# Patient Record
Sex: Female | Born: 1960 | Race: Black or African American | Hispanic: No | Marital: Married
Health system: Southern US, Community
[De-identification: ages and names within clinical notes are randomized; demographics above are authoritative.]

---

## 1998-06-16 ENCOUNTER — Ambulatory Visit: Admission: RE | Admit: 1998-06-16 | Discharge: 1998-06-16 | Payer: Self-pay | Admitting: Otolaryngology

## 2000-09-29 ENCOUNTER — Encounter: Payer: Self-pay | Admitting: *Deleted

## 2000-09-29 ENCOUNTER — Emergency Department (HOSPITAL_COMMUNITY): Admission: EM | Admit: 2000-09-29 | Discharge: 2000-09-30 | Payer: Self-pay | Admitting: Emergency Medicine

## 2003-08-20 ENCOUNTER — Ambulatory Visit (HOSPITAL_COMMUNITY): Admission: RE | Admit: 2003-08-20 | Discharge: 2003-08-20 | Payer: Self-pay | Admitting: Gastroenterology

## 2005-03-25 ENCOUNTER — Ambulatory Visit: Payer: Self-pay | Admitting: Physical Medicine & Rehabilitation

## 2005-03-25 ENCOUNTER — Inpatient Hospital Stay (HOSPITAL_COMMUNITY): Admission: EM | Admit: 2005-03-25 | Discharge: 2005-03-31 | Payer: Self-pay | Admitting: Emergency Medicine

## 2005-03-31 ENCOUNTER — Inpatient Hospital Stay (HOSPITAL_COMMUNITY)
Admission: RE | Admit: 2005-03-31 | Discharge: 2005-04-02 | Payer: Self-pay | Admitting: Physical Medicine & Rehabilitation

## 2005-08-26 ENCOUNTER — Encounter: Admission: RE | Admit: 2005-08-26 | Discharge: 2005-08-26 | Payer: Self-pay | Admitting: Specialist

## 2005-12-02 ENCOUNTER — Encounter: Admission: RE | Admit: 2005-12-02 | Discharge: 2005-12-02 | Payer: Self-pay | Admitting: Specialist

## 2007-01-04 ENCOUNTER — Inpatient Hospital Stay (HOSPITAL_COMMUNITY): Admission: RE | Admit: 2007-01-04 | Discharge: 2007-01-06 | Payer: Self-pay | Admitting: Specialist

## 2010-06-17 NOTE — Op Note (Signed)
NAMEREVERIE, VAQUERA          ACCOUNT NO.:  [id number]   MEDICAL RECORD NO.:  [id number]          PATIENT TYPE:  INP   LOCATION:  2550                         FACILITY:  [facility]   PHYSICIAN:  Kerrin Champagne, M.D.   DATE OF BIRTH:  1961/01/16   DATE OF PROCEDURE:  DATE OF DISCHARGE:                               OPERATIVE REPORT   PREOPERATIVE DIAGNOSIS:  Retained hardware, right proximal tibia status  post open reduction and internal fixation right bicondylar tibial  plateau fracture, February 2007.   POSTOPERATIVE DIAGNOSIS:  Retained hardware, right proximal tibia status  post open reduction and internal fixation right bicondylar tibial  plateau fracture, February 2007.   PROCEDURE:  Removal of right proximal tibial plates, both medial and  lateral, and screws.  Overdrilling of a single anterior proximal lateral  3.5 locking screw and bone grafting to the proximal tibia where the  screw was removed with allograft cancellous chips.   SURGEON:  Kerrin Champagne, M.D.   ANESTHESIA:  General via orotracheal intubation, Kaylyn Layer. Michelle Piper, M.D.   ESTIMATED BLOOD LOSS:  75 mL.   TOTAL TOURNIQUET TIME:  At 300 mmHg 1 hour 40 minutes.   BRIEF CLINICAL HISTORY:  The patient is a 50 year old female playing  basketball February of 2007.  She was going for a basket, fell.  She  sustained a significant bicondylar tibial plateau fracture of the right  knee and also left bimalleolar ankle fracture.  Underwent ORIF of the  left ankle and the right knee.  Both medial and lateral plates placed,  Ace proximal tibial plate as well as a lateral Ace buttress plate.  Locking screws were used proximally and distal cortical screws to the  medial plate.  The patient has had severe persistent knee pain since the  time of her procedure, underwent arthroscopic evaluation demonstrating  severe degenerative joint disease.  Medial joint line with torn  meniscus.  Torn meniscus was resected.  Pain has  persisted.  She returns  for removal of hardware potentially to eventually undergo total knee  arthroplasty in the future.   DESCRIPTION OF PROCEDURE:  After adequate general anesthesia, the  patient had the right lower extremity prepped with DuraPrep solution and  a tourniquet about the right upper thigh, draped in the usual manner.  Iodine body drape was used.  The right leg was elevated, exsanguinated  with an Esmarch bandage.  Tourniquet inflated to 300 mmHg.   The patient then had incision made through the ulnar incision scar  continuing from about the mid to upper 1/3 of the patella down through  the skin and subcutaneous layers through the peritenon developed  distally over the patella tendon.  A thick soft tissue sleeve was  carried down to the peritenon both proximal-medial and lateral.  Carried  down to the tibia below the anterior tibial tubercle and then dissected  laterally incising the anterior compartment along the lateral aspect of  the crest of the tibia and then continued proximally.  The incision then  was carried along the medial aspect of the patellar tendon and the  proximal medial tibial plateau was also  exposed to the level of the bed  patellar tendon.  Elevation of the large, thick soft tissue sleeve over  the anterior aspect of the tibia and then proximally to the titanium  plate, this was localized.  Small fragment screwdriver was then used to  remove the screws holding the proximal plate in place.  These were three  distal cortical screws  and three proximal cancellous screws.  The plate  was then easily elevated with a Cobb elevator and then removed.  Small  fronds of tissue that had been over the areas of previous holes were  removed using a small Beyer rongeur.  Irrigation was performed over this  medial site.  Fracture appeared to be well healed medially.  Lateral  plate was exposed through the incision and was then performed, elevating  over the plate  using a Cobb elevator exposing the plate from distal to  proximal.  Each of the distal five screws were then removed.  These were  five large fragment cortical screws and they were removed without  difficulty.  Two large proximal locking screws were removed without  difficulty and then the plate mobilized, unfortunately, two small 3.5  locking screws prevented some amount of difficulty, the lower posterior  one was able to be removed with the small fragment screwdriver.  The  anterior one, however, remaining difficult in terms of its ability to be  removed as it was seated within the rim of the plate proximally and  slightly angled so that the entire hex area could not be engaged with a  screwdriver.  Unfortunately, this area stripped and the hex area  stripped with attempts at removing the screw.  Plate was loosened and  brought outside of the soft tissue envelope, however, unable to loosen  the screw physically by removing it so that an attempt at using the easy-  out reverse threaded bit was unsuccessful as well proximally.  Therefore, a TPS with an end cutting rasp-like cutting bur was then used  to remove the screw head from the plate.  First a sponge was placed over  the soft tissue protecting the area.  Two tarsal-type small Homans were  then placed about the plate to retract the skin and soft tissue sleeve  laterally exposing this area and protecting it.  Irrigation was carried  out using copious amounts of irrigant solution and the bur then used to  remove the head of the screw as it engaged the plate proximally.  As  much care was taken as possible in order to keep metallic debris out of  the soft tissues present.  This was done without difficulty.  Once this  was carried out, then the plate was able to be easily removed.  Then  overdrill easy-out was then placed over the remaining screw threaded  portion of the shaft and this was then drilled down. Unfortunately, this  caused  the shaft of the screw to break about 3 to 4 mm deep to the  cortical bone leaving about 1/4 inch size opening present.  The  overreamer was then placed over this remaining area after carefully  curetting the area and exposing the tip of the screw within the bone and  used to further expose the screw.  Once it was further exposed, then the  screw was able to be grasped with a pliers and then the pliers then used  to easily remove the screw, rotating and turning and removing the screw  by twisting.  Once this was completed, soft tissues were debrided of  metallic debris where possible.  Irrigation was carried out.  Arthrotomy  was performed in the right knee through a anterolateral incision, a  vertical incision through the fat pad laterally into the synovium, the  knee joint was exposed, the meniscus preserved.  Evaluation of the  patient's anterior knee joint by placing into varus position  demonstrated the knee to be in good position and the knee to demonstrate  arthrosis changes primarily involving the lateral joint line with a  fissure into the lateral compartment joint irregularity, bone on bone  appearance.  No articular fragments were found within the joint and this  was irrigated with copious amounts of irrigant solution.  The synovium  was then reapproximated with interrupted 0 Vicryl sutures.  Fat pad  approximated with interrupted 0 Vicryl sutures.  The patient underwent  further irrigation in both areas where plates were removed and then the  subcutaneous layers involving the peritenon approximated with  interrupted 0 and #1 Vicryl sutures and the superficial fascial layer of  the anterior compartment of the knee approximated over the anterior  aspect of the knee to the medial subcutaneous layers using interrupted  #1 Vicryl sutures.  The subcutaneous tissue then approximated with  interrupted 2-0 Vicryl sutures and the skin closed with stainless steel  staples over a medium  Hemovac drain placed in the anterior compartment  on the right.   Please note that in the area where the screw had finally been removed  over the lateral aspect of the proximal tibia, bone grafting was  performed to this area of cortical defect using cancellous chips  allograft material and this was done prior to closure of the wound.  Closure of the incision then carried out as noted and then stainless  steel staples used to close the skin.  Adaptic, 4 x 4's, ABD pad fixed  to the skin with Kerlix.  Then a space wrap applied from the  right foot to right upper thigh.  Knee immobilizer applied.  Tourniquet  was released.  Total tourniquet time was 1 hour 40 minutes at 300 mmHg.  The patient was then reactivated, extubated, returned to the recovery  room in satisfactory condition.  All instrument and sponge counts were  correct.      Kerrin Champagne, M.D.  Electronically Signed     JEN/MEDQ  D:  01/04/2007  T:  01/04/2007  Job:  [id number]

## 2010-06-20 NOTE — Op Note (Signed)
[name], [name]                      ACCOUNT NO.:  [id number]   MEDICAL RECORD NO.:  [id number]                   PATIENT TYPE:  AMB   LOCATION:  DFTL                                 FACILITY:  [facility]   PHYSICIAN:  Anselmo Rod, M.D.               DATE OF BIRTH:  February 19, 1960   DATE OF PROCEDURE:  08/20/2003  DATE OF DISCHARGE:                                 OPERATIVE REPORT   PROCEDURE:  Colonoscopy.   ENDOSCOPIST:  Charna Elizabeth, M.D.   INSTRUMENT USED:  Olympus video colonoscope.   INDICATIONS FOR PROCEDURE:  Change in bowel habits with abdominal pain in a  50 year old female.  Rule out colonic polyps, masses, etc.   PROCEDURE PERFORMED:  Informed consent was procured from the patient.  The  patient fasted for eight hours prior to the procedure and prepped with a  bottle of magnesium citrate and a gallon of GOLYTELY the night prior to the  procedure.   PREPROCEDURE PHYSICAL EXAMINATION:  VITAL SIGNS:  The patient had stable  vital signs.  NECK:  Supple.  CHEST:  Clear to auscultation.  HEART:  S1 and S2 regular.  ABDOMEN:  Soft with normal bowel sounds.   DESCRIPTION OF PROCEDURE:  The patient was placed in the left lateral  decubitus position and sedated with 70 mg of Demerol and 7 mg of Versed  intravenously in incremental doses.  Once the patient was adequately sedated  and maintained on low flow oxygen and continuous cardiac monitoring, the  Olympus video colonoscope was advanced from the rectum to the cecum.  The  appendiceal orifice and ileocecal valve were clearly visualized and  photographed.  No masses, polyps, erosions, ulcerations, or diverticula were  seen.  Retroflexion in the rectum revealed normal anatomy.   IMPRESSION:  Normal colonoscopy up to the cecum.   RECOMMENDATIONS:  1. Repeat colonoscopy in the next 10 years unless the patient develops any     abnormal symptoms in the interim.  2. Outpatient followup in the next two weeks for further  recommendations.  3. Continue a high fiber diet for now with liberal fluid intake.                                               Anselmo Rod, M.D.    JNM/MEDQ  D:  08/20/2003  T:  08/20/2003  Job:  [id number]   cc:   Gabriel Earing, M.D.  [address]  [place]  [place] [address]  Fax: [phone]

## 2010-06-20 NOTE — Op Note (Signed)
Ashley [name], Ashley [name]          ACCOUNT NO.:  [id number]   MEDICAL RECORD NO.:  [id number]          PATIENT TYPE:  INP   LOCATION:  5040                         FACILITY:  [facility]   PHYSICIAN:  Kerrin Champagne, M.D.   DATE OF BIRTH:  06-03-1960   DATE OF PROCEDURE:  03/25/2005  DATE OF DISCHARGE:                                 OPERATIVE REPORT   PREOPERATIVE DIAGNOSIS:  Right knee bicondylar tibial plateau fracture with  minimally displaced medial tibial plateau and a split depressed lateral  plateau fracture. Significant displacement of the lateral plateau component.  Left ankle supination external rotation injury with a long oblique spiral  fracture of the fibula with comminution.  A fleck of bone off the medial  malleolus distally with a torn deltoid ligament.   POSTOPERATIVE DIAGNOSIS:  Right knee bicondylar tibial plateau fracture with  minimally displaced medial tibial plateau and a split depressed lateral  plateau fracture. Significant displacement of the lateral plateau component.  Left ankle supination external rotation injury with a long oblique spiral  fracture of the fibula with comminution.  A fleck of bone off the medial  malleolus distally with a torn deltoid ligament.   PROCEDURE:  1.  Open reduction and internal fixation of a right bicondylar tibial      plateau fracture via median approach with application of a six-hole      buttress plate to the anterior medial tibial plateau with fixation      screws anterior to posterior and three distal screws.  Open reduction      and internal fixation of the lateral plateau fracture by elevation of      the depressed fracture fragment bone graft to the proximal lateral      tibial plateau metaphysis using tricortical iliac crest bone graft and      cancellus chips.  Internal fixation using a DePuy trauma buttress plate      with five distal screws and six proximal locking it, only five of the      proximal locking screws  were used for fixation purposes.  2.  Open reduction internal fixation of left a bimalleolar ankle fracture      including the lateral malleolus and posterior malleolus with application      of an eight hole Ace 3.5 DCP and a 3.5 lag screw anterior to posterior.   SURGEON:  Kerrin Champagne, M.D.   ASSISTANT:  None.   ANESTHESIOLOGIST:  Kaylyn Layer. Michelle Piper, M.D.   TOTAL TOURNIQUET TIME:  Right leg 1 hour and 52 minutes, left leg 42  minutes.   ESTIMATED BLOOD LOSS:  Right knee 200 mL, left ankle 50 mL.   COMPLICATIONS:  None.   CLINICAL HISTORY:  This patient is a 50 year old female who reportedly had  been drinking some alcohol and playing basketball when she jumped for a  basket she returned down and tried to avoid landing on a 60-year-old.   INTRAOPERATIVE FINDINGS:  The patient had extremely comminuted to the  depressed lateral tibial plateau fracture.  The central portions of the  fracture surface of the lateral joint line were carefully  elevated and  placed into position alignment, bone grafting then underneath using  cancellus chips as well as a tricortical iliac crest bone graft and  allograft piece.  This tended to support the surface quite nicely, returning  the leg to neutral.  The left ankle reduced quite nicely with a single lag  screw approximating the major portions of the fracture fragment and then  using an eight hole DCP to approximate the fracture site.   DESCRIPTION OF PROCEDURE:  After adequate general anesthesia, the patient  transferred to the fracture table.  C-arm fluoro was draped sterilely  brought into the field.  After standard prep with DuraPrep solution, both  lower extremities dual the lower extremity drape, tourniquets about the  upper thigh at both levels with separate tourniquet machines.  Given  preoperative antibiotics of Ancef.  Dissection was carried deeply down to  the deep fascia which was the superficial retinaculum of the knee and the   external quad mechanism.  Both medial and lateral flaps were developed deep  to the superficial fascial layer exposing some areas of the lateral aspect  of the deep retinaculum in the lateral view. Incision then carried down to  the patella tendon, also elevating the superficial fascia off the patella  tendon bilaterally down to the anterior.  Dissection then carried laterally  elevating the anterior compartment off of the right proximal tibia and then  laterally.  Also elevating the soft tissues medially along the medial tibial  metaphysis.  And elevating this quite nicely exposing the anterior aspect of  the proximal tibia medially and laterally.  The retinaculum knee and  synovial layer was maintained in its attachments to the proximal tibia  laterally.  An incision was made into the lateral third of the patellar  tendon then carried up to the patella itself and then this was used to  rotate the lateral fracture in anatomic position alignment.  An Ace buttress  plate was then carefully approximated to the anterior aspect of the medial  tibial plateau, it provided excellent fixation against the surface.  The  screws were then placed from anterior to posterior using 3 cortical screws  proximally and then distally 3.5 screws as well, fixing the medial tibial  plateau in anatomic position and alignment.  Attention was then turned to  the lateral tibial plateau surface which was approached posteriorly to open  up the metaphysis internally.  Bone grafting was then performed using  cancellus chips impacting them with a bone impactor and then tricortical  iliac crest to elevate the joint surface to hold it steadfast.  The incision  into the patella tendon was continued through the anterior fat pad of the  knee into the knee joint, the knee joint could be palpated.  There were no  loose fragments and the joint surface appeared to reduce with the elevation. A lateral split was then reduced  carefully and tongs placed across the  bicondylar area displaying the leg in varus until the fragment reduced to  near anatomic position alignment.  With this in place, then the tibial  buttress plate for the right-sided leg was then placed on the lateral  plateau surface carefully approximated against the lateral plateau surface.  Distal five screw holes were then each individually drilled and approximated  using cortical screws, fixing the plate and buttressing the lateral tibial  plateau quite nicely into position alignment.  The three screw holes  available for locking screws were then filled to the whole  level just below  the more proximal three hole levels.  These were filled using 4.5 locking  screws.  More proximal screw holes were then filled also using locking  screws.  Again, holding up the lateral joint surface and buttressing it  quite nicely.  This completed fixation of the proximal bicondylar tibial  plateau fracture.  Irrigation was performed.  Careful inspection  demonstrated the muscles to be viable over the anterior lateral aspect of  the leg.  A medium Hemovac drain was placed both over the medial side of the  proximal tibia and lateral proximal tibia.  The anterior compartment  carefully sutured its superficial fascial layers down to the anterior crest  of the knee.  Subcu layers were then approximated with interrupted 0-0 and 2-  0 Vicryl sutures and skin approximated with a running subcu stitch of 3-0  Vicryl suture.  Tincture of Benzoin and Steri-Strips applied, 4x4s, ABD pad  affixed to the skin with sterile Webril and then a well-padded long-leg the  dressing of Ace wraps applied and knee immobilizer placed.  A pad placed  under the right ankle and heel to keep it off of the operating table.   Attention then turned to the left ankle.  The left leg was elevated,  exsanguinated with Esmarch bandage, tourniquet inflated to 250 mmHg.  An  incision was made along the  lateral aspect of the fibula superficially  extending from the tip of the fibula proximally over an area of nearly 6  inches through the skin and subcu layers directly down to the superficial  fibula.  The incision was carried down to the periosteum.  The periosteum  was then elevated anterior and proximal.  Fracture site identified.  The  fracture over the anterior aspect of the distal fibula was identified, I  externally rotated the leg irrigating the joint in order to remove any bony  debris through this lateral approach.  The ankle was then reduced by  internal rotation and longitudinal traction and then a clamp placed over the  major portions of the fracture site distally reapproximating and  interdigitating the fracture margins to anatomic position alignment.  This  was held in place with a reducing tenaculum using a proximal lobster claw.  With this held in place, a 3.5 drill bit was used to make an oblique hole from anterior to posterior to lie across the fracture site.  A sleeve was  then, measured for depth and the appropriate size screw place.  A 3.5 DCP  plate of the Ace type was then carefully contoured to the lateral aspect of  the fracture site.  This was placed over the posterior lateral aspect of the  fibula and contoured to the distal crest of the fibula as it flared.  This  plate was placed over the posterior long oblique fracture proximally in  order to hold it reduced when then compressed down.  Then, the plate was  then carefully placed and screwed into place leaving a single open over the  oblique fracture site.  Distally three fully threaded cancellus screws were  used, proximally four cortical screws were used to fix the fibula in good  position alignment.  Irrigation was then performed.  There was no active  bleeding present.  The wound was then closed after C-arm evaluation of joint  space returned to anatomic position alignment.  There was no evidence of   displacement of either medial or posterior malleolar fragments.  Irrigation  was further  performed and then this deep subcu layers approximated with  interrupted 0 Vicryl sutures, more superficial layers with interrupted 2-0  Vicryl sutures, skin closed with stainless steel staples.  Dressing of  Adaptic 4x4s, ABD pad affixed to the skin with sterile Webril.  Then a well-  padded posterior splint applied to the left leg extending from the  metatarsal phalangeal joints to just below the knee.  The ankle held in 90  degrees.  The patient's tourniquet was released and the total tourniquet  time was 42 minutes for the left ankle.  The patient was then reactivated,  returned to her bed, extubated, returned to the recovery room in  satisfactory condition.  All instrument and sponge counts were correct  regarding this patient.  She was discharged to the postop anesthesia care  unit in satisfactory condition.      Kerrin Champagne, M.D.  Electronically Signed     JEN/MEDQ  D:  03/27/2005  T:  03/28/2005  Job:  [id number]

## 2010-06-20 NOTE — Discharge Summary (Signed)
NAMESHARMAIN, [name]          ACCOUNT NO.:  [id number]   MEDICAL RECORD NO.:  [id number]          PATIENT TYPE:  IPS   LOCATION:  4001                         FACILITY:  [facility]   PHYSICIAN:  Ranelle Oyster, M.D.DATE OF BIRTH:  1960/12/15   DATE OF ADMISSION:  03/31/2005  DATE OF DISCHARGE:  04/02/2005                                 DISCHARGE SUMMARY   DISCHARGE DIAGNOSES:  1.  Right bicondylar tibial plateau fracture with displacement and left      trimalleolar ankle fracture.  2.  Acute blood loss anemia, stable.  3.  Urinary tract infection.  4.  Acute hypokalemia, resolved.   HISTORY OF PRESENT ILLNESS:  Ashley [name] is a 50 year old female with a  history of osteoporosis, who fell March 24, 2005 while playing basketball  and landed on bilateral lower extremities, sustaining closed left  trimalleolar ankle fracture and right knee bicondylar tibial plateau  fracture with displaced fragments.  She underwent ORIF of right knee  bicondylar fracture, closed reduction, left ankle, with splint placement.  She is currently non-weightbearing, bilateral lower extremities, plus a  Bledsoe brace is used on right knee with range of motion at 0-90 degrees in  brace.  She was also noted to have a fracture blister on medial aspect of  right knee incision.  She was noted to have postop fevers that are  resolving.  Acute blood loss anemia was noted with most recent H&H at 3.4  and 24.9.  Therapies were initiated and the patient was noted to require  assistance with ADLs and mobility.  Rehab consulted for further therapy.   PAST MEDICAL HISTORY:  Past medical history significant for:  1.  TAH.  2.  Osteoporosis.   ALLERGIES:  No known drug allergies.   FAMILY HISTORY:  Positive for coronary artery disease.   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  She and her  husband take care of 2 grandchildren at home.  She  currently has a daughter from out of town who is assisting them.  The  patient does not use any tobacco, ? alcohol use.   HOSPITAL COURSE:  Ashley [name] was admitted to Rehab on March 31, 2005 for inpatient therapies to consist of PT and OT daily.  Past  admission, she was maintained at non-weightbearing status in bilateral lower  extremities.  Coumadin was continued for DVT prophylaxis.  Secondary to  fever, UA/UCS were sent off.  Labs were done past admission showing H&H at  8.6 and 28.6.  The patient was maintained on iron supplements.  Check of  electrolytes showed hypokalemia with potassium at 3.3 and she was started on  potassium supplement.  The patient's right knee incision was noted to be  healing well without any signs or symptoms of infection.  Staples were noted  to be in place.  Left lower extremity was in splint and noted to be  neurovascularly intact.  Evaluation of patient past admission showed the  patient  to be at relatively high level.  The patient did progress to  modified-independent level for bed mobility, modified-independent for  sliding board transfers by April 02, 2005.  She was modified-independent for  navigating wheelchair 150 feet.  She requires setup-assist for ADLs and was  able to bathe and dress herself without difficulty, was able to toilet at  supervision level.  Followup labs of April 02, 2005 showed H&H stable at 8.9  and 26.5.  Check of electrolytes revealed hypokalemia to have resolved with  sodium 136, potassium 3.7, chloride 100, CO2 27, BUN 10, creatinine 0.4,  glucose 98.  The patient's UA was noted to be positive.  The patient was  started on amoxicillin x5 days.  Patient to continue on iron supplement for  now.  She is to follow up with LMD for recheck CBC as well as followup UA/UC  in approximately a month.  She is to continue on potassium supplements for  the next 48 hours.   Patient to continue on Coumadin for now with next pro  time to be checked on April 03, 2005, Dr. Otelia Sergeant to decide on complete  duration of Coumadin, depending on the patient's mobility.  On April 02, 2005, the patient is discharged to home.  He is to have continued followup  home health PT by [facility].   DISCHARGE MEDICATIONS:  1.  Coumadin 2 mg two p.o. q.p.m.  2.  OxyContin CR 20 mg p.o. q.12 h. x2 weeks and 20 mg one per day x10 days,      then discontinue.  3.  Trinsicon one p.o. twice daily.  4.  Claritin-D one p.o. per day.  5.  Senokot-S two p.o. nightly.  6.  Robaxin 500 mg p.o. 4 times daily.  7.  K-Dur 20 mEq twice daily x2 days.  8.  OxyIR 5 mg to 10 mg q.4-6 h. p.r.n. pain.  9.  Amoxicillin 250 mg 3 times daily x5 days.   ACTIVITY:  Activity level is as tolerated with wheelchair, no weight on  bilateral lower extremities, no walking, Bledsoe brace to right knee at all  times, keep splint on left ankle clean and dry.   DIET:  Regular.   FOLLOWUP:  Patient to follow up with Dr. Otelia Sergeant next Tuesday for recheck and  to decide on duration of Coumadin, follow up with Dr. Leonette Most in 1 month,  follow up with Dr. Faith Rogue as needed.      Greg Cutter, P.A.      Ranelle Oyster, M.D.  Electronically Signed    PP/MEDQ  D:  04/02/2005  T:  04/03/2005  Job:  [id number]   cc:   Loyal Jacobson MD   Kerrin Champagne, M.D.  Fax: [phone]

## 2010-06-20 NOTE — Discharge Summary (Signed)
Ashley [name], Ashley [name]          ACCOUNT NO.:  [id number]   MEDICAL RECORD NO.:  [id number]          PATIENT TYPE:  INP   LOCATION:  5040                         FACILITY:  [facility]   PHYSICIAN:  Kerrin Champagne, M.D.   DATE OF BIRTH:  [birth year]   DATE OF ADMISSION:  03/24/2005  DATE OF DISCHARGE:  03/31/2005                                 DISCHARGE SUMMARY   ADMISSION DIAGNOSES:  1.  Bicondylar tibial plateau fracture, closed on the right.  2.  Trimalleolar ankle fracture dislocation on the left.  3.  Osteoporosis.   DISCHARGE DIAGNOSES:  1.  Bicondylar tibial plateau fracture, closed on the right.  2.  Trimalleolar ankle fracture dislocation on the left.  3.  Osteoporosis.  4.  Posthemorrhagic anemia.   PROCEDURES:  1.  On March 25, 2005, the patient underwent open reduction internal      fixation right bicondylar tibial plateau fracture and open reduction      internal fixation of lateral plateau fracture utilizing tricortical      iliac crest bone graft and cancellus chips on the right.  2.  Reaction internal fixation of left bimalleolar ankle fracture including      a lateral malleolus and posterior malleolus with application of plates      and screws.  This was performed by Dr. Otelia Sergeant under general anesthesia.   CONSULTATIONS:  Physical medicine and rehabilitation of [facility].   BRIEF HISTORY:  The patient's 50 year old black female who was playing  basketball and when she jumped from a rebound, tried to avoid landing on a 50-  year-old child and turned twisting her right knee and left ankle.  She was  brought to [facility] Emergency Room.  There, she  underwent close reduction of the left ankle fracture by Dr. Read Drivers.  She was  seen by Dr. Otelia Sergeant and admitted to the hospital for surgical intervention.   BRIEF HOSPITAL COURSE:  The patient tolerated the procedure under general  anesthesia without complications.   Postoperatively, she was started on  physical therapy but was not allowed weightbearing on either lower  extremity.  Physical therapy assisted with the bed to chair transfers  utilizing a sliding board.  She was eventually able to do bed to wheelchair  transfers.  She had considerable difficulty in her activity level and rehab  consult was obtained.  During the hospital stay, Hemovac drains were  discontinued on the second postoperative day.  Dressing changes were done  daily thereafter.  She was noted to have fracture blisters at the right knee  which were treated with daily dressing changes.  Bilateral Doppler studies  to rule out DVT on postop day #2 was negative.  The patient underwent active  and passive range of motion of the right ankle and left knee.  Initially,  she utilized PCA analgesics and was weaned to p.o. analgesics without  difficulty.  She did complain of sinus drainage which was treated with over-  the-counter medications.  The patient was placed on Coumadin for DVT  prophylaxis.  On March 30, 2005, the patient  was fitted with a Bledsoe  brace to the right knee to begin active range of motion.  A rehab bed did  become available and patient was transferred for continuation of her  orthopedic care.   PERTINENT LABORATORY VALUES:  CBC on admission with a WBC 10.8, hemoglobin  12, hematocrit 35.8.  Hemoglobin and hematocrit dropped to the lowest value  of 8.4 and 24.9, respectively.  Chemistry studies on admission were within  normal limits with exception of mildly elevated glucose of 136.  The patient  had one episode of hyponatremia which resolved.  Coagulation studies on  admission were normal.  Prior to transfer to rehab INR noted to be 2.5.  Urinalysis preoperatively was negative.  On March 31, 2005, after Foley  catheter discontinued, urinalysis showed moderate hemoglobin with 30  protein, moderate leukocyte esterase, many epithelial cells, 3 to 6 WBCs, 3  to 6  RBCs and many bacteria.   PLAN:  The patient was transferred to [facility].  There she will continue to do bed to wheelchair transfers, nonweightbearing  bilateral lower extremities.  Range of motion to the right knee with Bledsoe  brace open at 0 to 90 degrees.  She will continue with range of motion of  the left knee actively.  Right ankle range of motion as well.  Occupational  therapy will assist with ADLs.  A list of medications were sent with the  patient and will continue to be utilized there.  She will remain on DVT  prophylaxis.  The patient is advised to follow up with Dr. Otelia Sergeant following  her stay rehab.      Wende Neighbors, P.A.      Kerrin Champagne, M.D.  Electronically Signed    SMV/MEDQ  D:  07/20/2005  T:  07/21/2005  Job:  [id number]

## 2010-06-20 NOTE — H&P (Signed)
[name], [name]          ACCOUNT NO.:  [id number]   MEDICAL RECORD NO.:  [id number]          PATIENT TYPE:  IPS   LOCATION:  4001                         FACILITY:  [facility]   PHYSICIAN:  Erick Colace, M.D.DATE OF BIRTH:  16-Nov-1960   DATE OF ADMISSION:  03/31/2005  DATE OF DISCHARGE:                                HISTORY & PHYSICAL   A 50 year old female with history of osteoporosis who fell on March 24, 2005, while playing basketball and landed on bilateral lower extremities,  sustained closed left trimalleolar ankle fracture and right knee bicondylar  tibial plateau fracture with displaced fragments.  She underwent ORIF right  knee bicondylar fracture as well as left ankle.  She is postoperatively  nonweightbearing, placed in a Bledsoe brace on the right with range of  motion 0 to 90 degrees within the brace. She has a splint on the left ankle.  Postoperative fever 101.  Pain control has been poor with movement.  Acute  blood loss anemia with hemoglobin 8.4 on February 27.  INR was 2.5.   REVIEW OF SYSTEMS:  Positive for pain in the legs, no numbness.  No bowel or  bladder difficulties other than some constipation resolved by laxatives.   PAST MEDICAL HISTORY:  1.  Total abdominal hysterectomy.  2.  Osteoporosis.   FAMILY HISTORY:  Coronary artery disease.   SOCIAL HISTORY:  Married.  Lives in one-level home with two steps to enter.  Working as Tax adviser for [employer].  Two grandchildren at home.  No  alcohol, no tobacco.   FUNCTIONAL HISTORY:  Independent prior to admission.   FUNCTIONAL STATUS:  Impaired mobility and ADLs.   LABORATORY DATA:  Chest x-ray February 21: No acute disease.   ALLERGIES:  None known.   HOME MEDICATIONS:  None.   PHYSICAL EXAMINATION:  VITAL SIGNS: Blood pressure 106/66, pulse 102,  respirations 20, temperature 98.2.  GENERAL:  No acute distress.  Mood and affect appropriate.  HEENT:  Eyes anicteric, not injected.  NECK: Supple without adenopathy.  LUNGS: Respiratory effort is good, lungs clear.  HEART: Regular rate and rhythm.  Pedal pulses are unable to assess secondary  to splints.  ABDOMEN:  Positive bowel sounds. Soft, nontender to palpation.  EXTREMITIES: Normal bilateral upper extremities. Right lower extremity in  Bledsoe brace, has ABD over incision.  Left ankle in a splint with ACE wrap.  She has good sensation in her toes bilaterally.  No toe swelling.  She has  5/5 strength bilateral upper extremities and 4/5 left hip flexor. Knee  extensor, ankle dorsiflexor, and plantar flexor cannot be assessed.  On the  right side, she has 1 in the hip flexor, and quad cannot be assessed, TA or  gastroc.   IMPRESSION:  1.  Right bicondylar tibial plateau fracture, left trimalleolar fracture      status post open reduction and internal fixation.  Nonweightbearing      bilateral lower extremities due to a fall sustained while playing      basketball March 24, 2005.  She is unable to ambulate and still is      requiring  assistance even to get in and out of wheelchair and in and out      of bed, toilet, as well as dressing and bathing.  2.  Bilateral lower extremity pain due to fractures. Will increase OxyContin      CR to 20 mg p.o. q.12h.  3.  Fever of unknown etiology, may be a urinary tract infection.  Will check      UA C&S.  She has no symptoms in this regard.  4.  Acute blood loss anemia.  Add iron supplement and monitor CBC.  Will      check one in the morning.  5.  Constipation.  Add Senna.   ESTIMATED LENGTH OF STAY:  5 to 7 days.  The patient's own goal is 3 days.      Erick Colace, M.D.  Electronically Signed     AEK/MEDQ  D:  03/31/2005  T:  03/31/2005  Job:  [id number]   cc:   Anselmo Rod, M.D.  Fax: [phone]   Kerrin Champagne, M.D.  Fax: [phone]   Ranelle Oyster, M.D.  Fax: [phone]

## 2010-11-10 LAB — TYPE AND SCREEN
ABO/RH(D): O POS
Antibody Screen: NEGATIVE

## 2010-11-11 LAB — CBC: MCV: 76.7 — ABNORMAL LOW

## 2012-05-13 ENCOUNTER — Other Ambulatory Visit (HOSPITAL_BASED_OUTPATIENT_CLINIC_OR_DEPARTMENT_OTHER): Payer: Self-pay | Admitting: Family Medicine

## 2012-05-13 ENCOUNTER — Ambulatory Visit (HOSPITAL_BASED_OUTPATIENT_CLINIC_OR_DEPARTMENT_OTHER)
Admission: RE | Admit: 2012-05-13 | Discharge: 2012-05-13 | Disposition: A | Discharge status: Home / Self Care | Payer: Managed Care, Other (non HMO) | Source: Ambulatory Visit | Attending: Family Medicine | Admitting: Family Medicine

## 2012-05-13 DIAGNOSIS — R079 Chest pain, unspecified: Secondary | ICD-10-CM

## 2013-02-10 ENCOUNTER — Other Ambulatory Visit (HOSPITAL_COMMUNITY): Payer: Self-pay | Admitting: Obstetrics and Gynecology

## 2013-02-10 DIAGNOSIS — E041 Nontoxic single thyroid nodule: Secondary | ICD-10-CM

## 2013-02-14 ENCOUNTER — Ambulatory Visit (HOSPITAL_COMMUNITY)
Admission: RE | Admit: 2013-02-14 | Discharge: 2013-02-14 | Disposition: A | Discharge status: Home / Self Care | Payer: Managed Care, Other (non HMO) | Source: Ambulatory Visit | Attending: Obstetrics and Gynecology | Admitting: Obstetrics and Gynecology

## 2013-02-14 DIAGNOSIS — E042 Nontoxic multinodular goiter: Secondary | ICD-10-CM | POA: Insufficient documentation

## 2013-02-14 DIAGNOSIS — E041 Nontoxic single thyroid nodule: Secondary | ICD-10-CM

## 2015-07-20 IMAGING — US US SOFT TISSUE HEAD/NECK
1 series · 14 of 25 positions shown · non-contrast
Comparison: None.

CLINICAL DATA: Right-sided thyroid nodule

EXAM:
THYROID ULTRASOUND
TECHNIQUE: Ultrasound examination of the thyroid gland and adjacent soft
tissues was performed.

[Series 1: us soft tissue head/neck · 0.07mm/px · 14 of 51 slices shown]
[im 1/51]
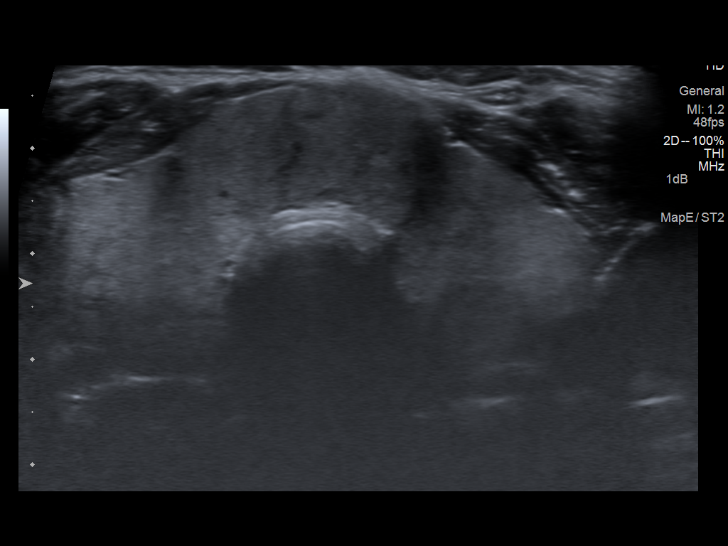
[im 5/51]
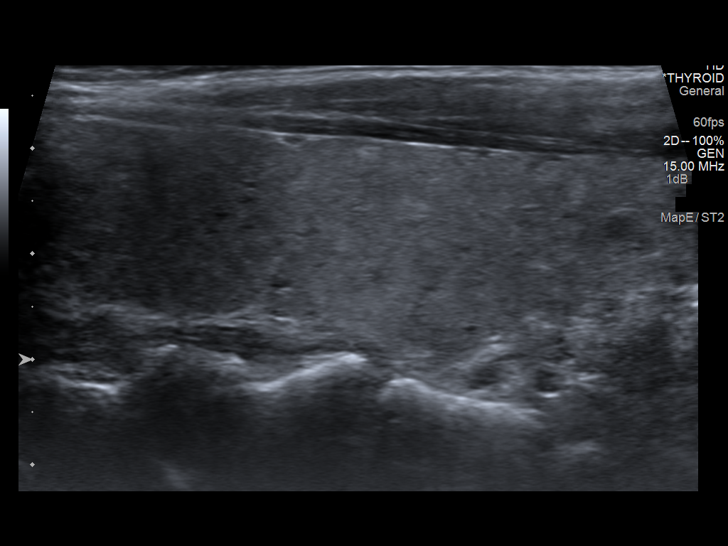
[im 9/51]
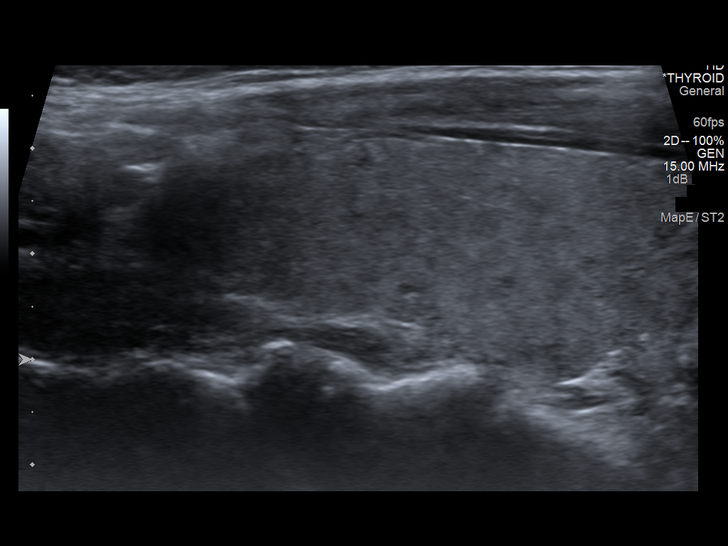
[im 13/51]
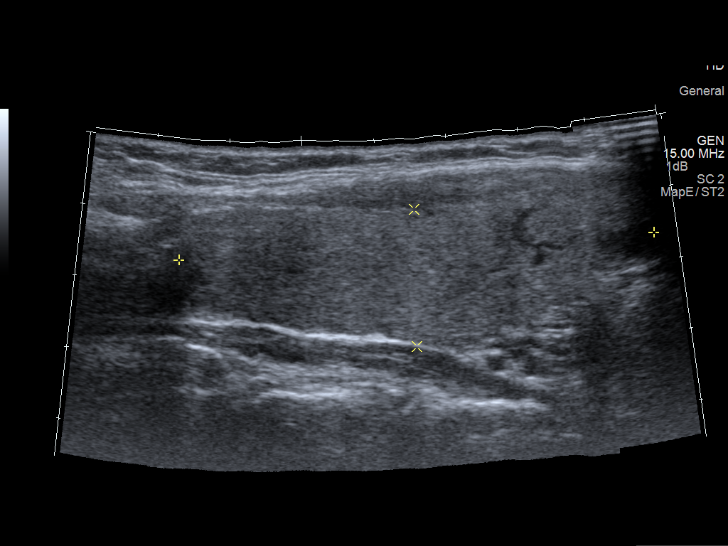
[im 17/51]
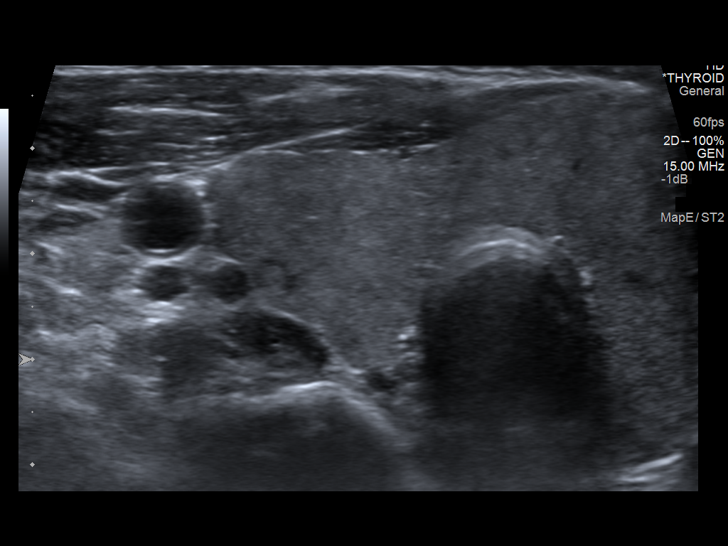
[im 19/51]
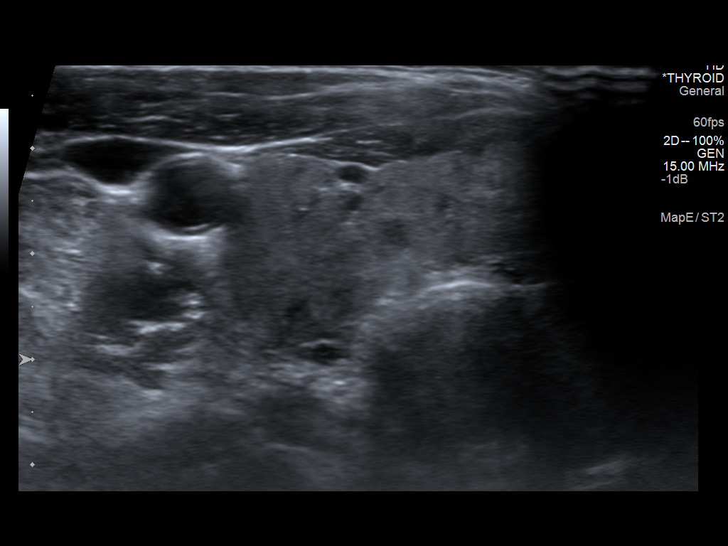
[im 23/51]
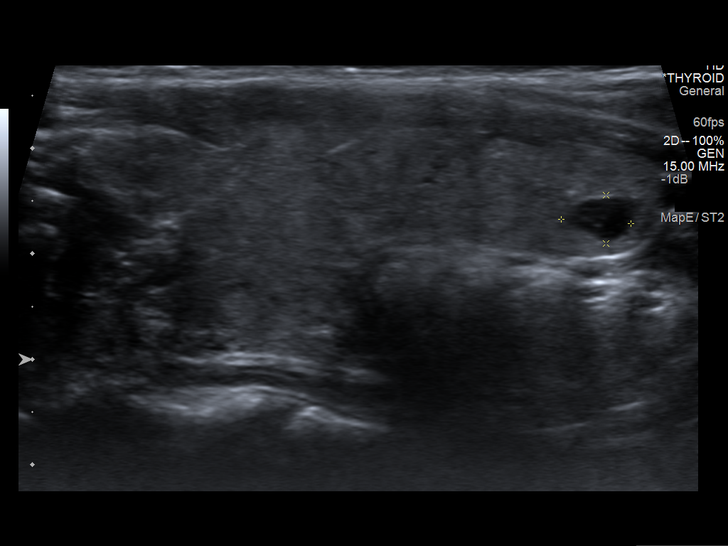
[im 28/51]
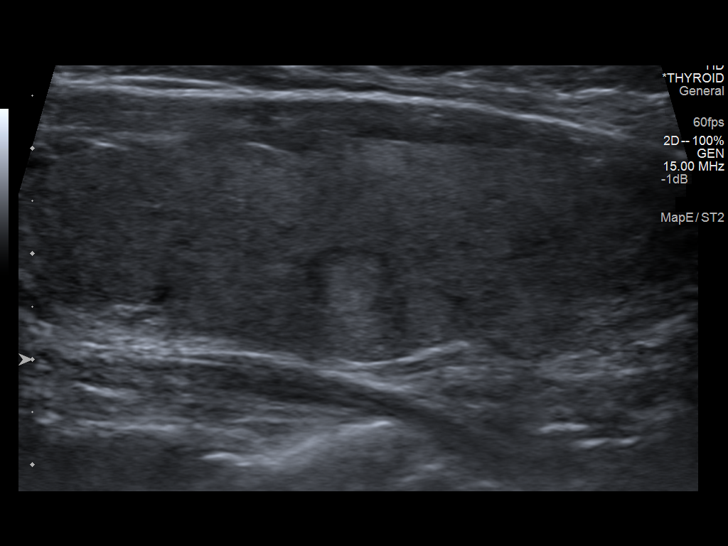
[im 32/51]
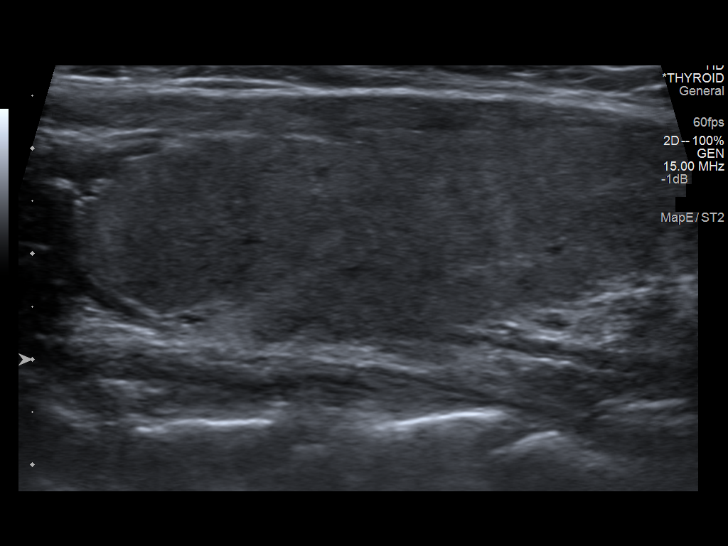
[im 34/51]
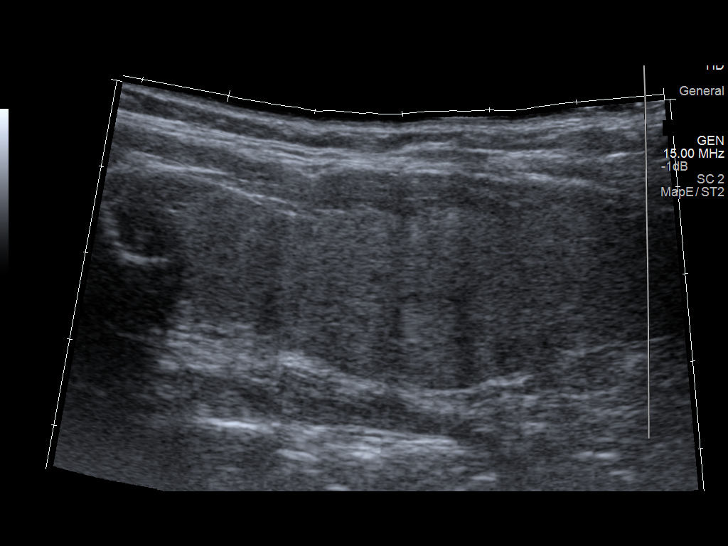
[im 38/51]
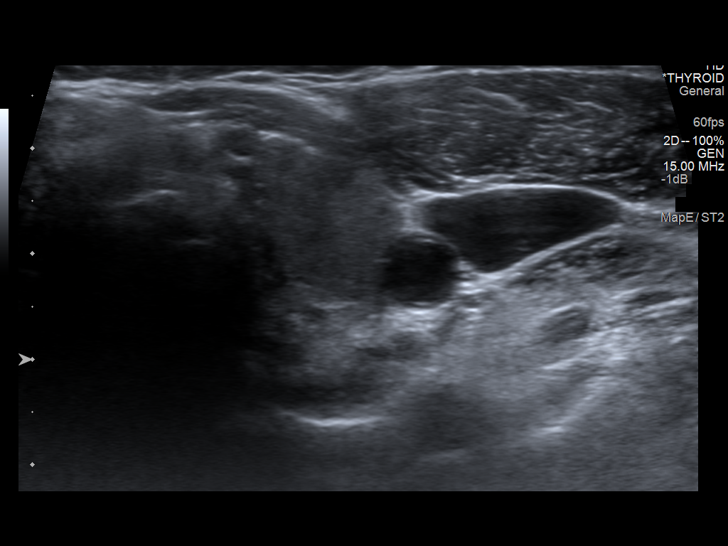
[im 42/51]
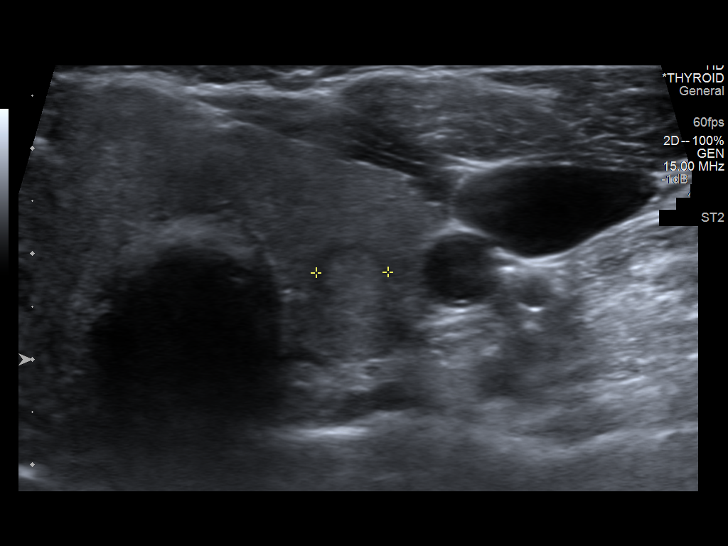
[im 46/51]
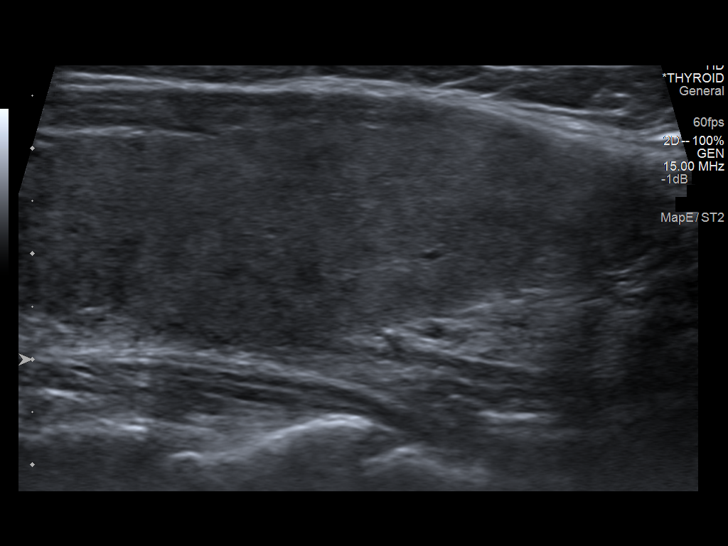
[im 51/51]
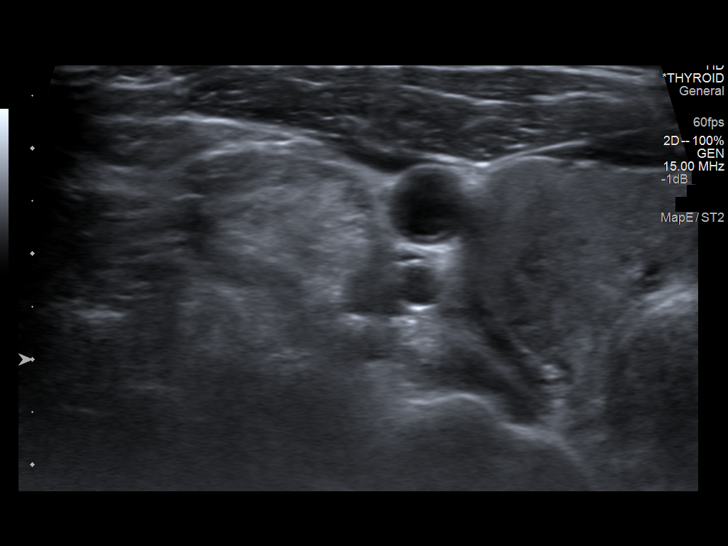

[14 of 25 positions shown; findings below may reference images not displayed]

FINDINGS: Right thyroid lobe

Measurements: Enlarged measuring 6.6 x 1.9 x 2.3 cm.

Right, inferior, medial - 0.5 x 0.7 x 0.5 cm - hypoechoic with
minimal internal echogenicity, likely solid

Left thyroid lobe

Measurements: Enlarged measuring 6.3 x 2.2 x 1.8 cm. No nodules
visualized.

Left, mid, posterior - 0.7 x 0.7 x 0.7 cm - hyperechoic, solid

Isthmus

Thickness: Enlarged measuring 1.2 cm in diameter. No nodules
visualized.

Lymphadenopathy

None visualized.
IMPRESSION: 1. Nonspecific mild thyromegaly. Correlation with thyroid function
tests is recommended.
2. Bilateral subcentimeter thyroid nodules, neither of which meet
imaging criteria to recommend percutaneous sampling.
This recommendation follows the consensus statement: Management of
Thyroid Nodules Detected at US: Society of Radiologists in
Ultrasound Consensus Conference Statement. Radiology 1332;
